# Patient Record
Sex: Male | Born: 1995 | Race: White | Hispanic: No | Marital: Single | Smoking: Current every day smoker
Health system: Southern US, Community
[De-identification: ages and names within clinical notes are randomized; demographics above are authoritative.]

## PROBLEM LIST (undated history)

## (undated) DIAGNOSIS — J45909 Unspecified asthma, uncomplicated: Secondary | ICD-10-CM

---
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
## 2017-07-30 ENCOUNTER — Emergency Department (HOSPITAL_COMMUNITY)
Admission: EM | Admit: 2017-07-30 | Discharge: 2017-07-31 | Disposition: A | Discharge status: Home / Self Care | Payer: Medicare Other | Attending: Emergency Medicine | Admitting: Emergency Medicine

## 2017-07-30 ENCOUNTER — Encounter (HOSPITAL_COMMUNITY): Payer: Self-pay | Admitting: *Deleted

## 2017-07-30 ENCOUNTER — Emergency Department (HOSPITAL_COMMUNITY): Payer: Medicare Other

## 2017-07-30 DIAGNOSIS — W260XXA Contact with knife, initial encounter: Secondary | ICD-10-CM | POA: Insufficient documentation

## 2017-07-30 DIAGNOSIS — Z79899 Other long term (current) drug therapy: Secondary | ICD-10-CM | POA: Insufficient documentation

## 2017-07-30 DIAGNOSIS — J45909 Unspecified asthma, uncomplicated: Secondary | ICD-10-CM | POA: Diagnosis not present

## 2017-07-30 DIAGNOSIS — T148XXA Other injury of unspecified body region, initial encounter: Secondary | ICD-10-CM

## 2017-07-30 DIAGNOSIS — S8992XA Unspecified injury of left lower leg, initial encounter: Secondary | ICD-10-CM | POA: Diagnosis present

## 2017-07-30 DIAGNOSIS — Y999 Unspecified external cause status: Secondary | ICD-10-CM | POA: Insufficient documentation

## 2017-07-30 DIAGNOSIS — F17200 Nicotine dependence, unspecified, uncomplicated: Secondary | ICD-10-CM | POA: Diagnosis not present

## 2017-07-30 DIAGNOSIS — Y939 Activity, unspecified: Secondary | ICD-10-CM | POA: Diagnosis not present

## 2017-07-30 DIAGNOSIS — Y92003 Bedroom of unspecified non-institutional (private) residence as the place of occurrence of the external cause: Secondary | ICD-10-CM | POA: Insufficient documentation

## 2017-07-30 DIAGNOSIS — Z23 Encounter for immunization: Secondary | ICD-10-CM | POA: Diagnosis not present

## 2017-07-30 DIAGNOSIS — S81812A Laceration without foreign body, left lower leg, initial encounter: Secondary | ICD-10-CM | POA: Diagnosis not present

## 2017-07-30 MED ORDER — HYDROCODONE-ACETAMINOPHEN 5-325 MG PO TABS
2.0000 | ORAL_TABLET | Freq: Once | ORAL | Status: AC
  Administered 2017-07-30: 2 via ORAL
  Filled 2017-07-30: qty 2

## 2017-07-30 MED ORDER — TETANUS-DIPHTH-ACELL PERTUSSIS 5-2.5-18.5 LF-MCG/0.5 IM SUSP
0.5000 mL | Freq: Once | INTRAMUSCULAR | Status: AC
  Administered 2017-07-30: 0.5 mL via INTRAMUSCULAR
  Filled 2017-07-30: qty 0.5

## 2017-07-30 MED ORDER — LIDOCAINE-EPINEPHRINE (PF) 2 %-1:200000 IJ SOLN
10.0000 mL | Freq: Once | INTRAMUSCULAR | Status: AC
  Administered 2017-07-30: 10 mL
  Filled 2017-07-30: qty 20
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
## 2017-07-30 NOTE — ED Notes (Signed)
Patient transported to X-ray 

## 2017-07-31 MED ORDER — NAPROXEN 500 MG PO TABS: 500.0000 mg | ORAL_TABLET | Freq: Two times a day (BID) | ORAL | 0 refills | Status: AC

## 2017-07-31 MED ORDER — HYDROCODONE-ACETAMINOPHEN 5-325 MG PO TABS: 1.0000 | ORAL_TABLET | Freq: Four times a day (QID) | ORAL | 0 refills | Status: AC | PRN
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
## 2017-07-31 NOTE — Discharge Instructions (Signed)
Medications: naprosyn, Norco  Treatment: Take Naprosyn twice daily as needed for your pain. Take 1-2 Norco every 6 hours as needed for severe pain. Keep dressing applied for 24 hours and wash with warm soapy water and apply antibiotic ointment and clean dressing every day until sutures are removed. Use crutches to ambulate as needed. Elevate your leg whenever you're not walking on it.  Do not drink alcohol, drive, operate machinery or participate in any other potentially dangerous activities while taking opiate pain medication as it may make you sleepy. Do not take this medication with any other sedating medications, either prescription or over-the-counter. If you were prescribed Percocet or Vicodin, do not take these with acetaminophen (Tylenol) as it is already contained within these medications and overdose of Tylenol is dangerous.   This medication is an opiate (or narcotic) pain medication and can be habit forming.  Use it as little as possible to achieve adequate pain control.  Do not use or use it with extreme caution if you have a history of opiate abuse or dependence. This medication is intended for your use only - do not give any to anyone else and keep it in a secure place where nobody else, especially children, have access to it. It will also cause or worsen constipation, so you may want to consider taking an over-the-counter stool softener while you are taking this medication.  Follow-up: Please return to the emergency department in 10-14 days to have your sutures removed. Please return sooner if you develop any new or worsening symptoms including fever, increasing pain, redness, swelling, drainage, red streaking from the area, your leg is getting cold, hard, or numb, or your sutures are coming out.

## 2017-07-31 NOTE — ED Notes (Signed)
Patient Alert and oriented X4. Stable and ambulatory. Patient verbalized understanding of the discharge instructions.  Patient belongings were taken by the patient.  

## 2017-08-13 ENCOUNTER — Emergency Department (HOSPITAL_COMMUNITY): Payer: Medicare Other

## 2017-08-13 ENCOUNTER — Emergency Department (HOSPITAL_COMMUNITY)
Admission: EM | Admit: 2017-08-13 | Discharge: 2017-08-13 | Disposition: A | Discharge status: Home / Self Care | Payer: Medicare Other | Attending: Emergency Medicine | Admitting: Emergency Medicine

## 2017-08-13 ENCOUNTER — Encounter (HOSPITAL_COMMUNITY): Payer: Self-pay | Admitting: *Deleted

## 2017-08-13 DIAGNOSIS — Z79899 Other long term (current) drug therapy: Secondary | ICD-10-CM | POA: Diagnosis not present

## 2017-08-13 DIAGNOSIS — J45909 Unspecified asthma, uncomplicated: Secondary | ICD-10-CM | POA: Insufficient documentation

## 2017-08-13 DIAGNOSIS — F17210 Nicotine dependence, cigarettes, uncomplicated: Secondary | ICD-10-CM | POA: Insufficient documentation

## 2017-08-13 DIAGNOSIS — Z4802 Encounter for removal of sutures: Secondary | ICD-10-CM | POA: Diagnosis present

## 2017-08-13 DIAGNOSIS — L089 Local infection of the skin and subcutaneous tissue, unspecified: Secondary | ICD-10-CM | POA: Insufficient documentation

## 2017-08-13 DIAGNOSIS — L7632 Postprocedural hematoma of skin and subcutaneous tissue following other procedure: Secondary | ICD-10-CM | POA: Diagnosis not present

## 2017-08-13 DIAGNOSIS — T148XXA Other injury of unspecified body region, initial encounter: Secondary | ICD-10-CM

## 2017-08-13 LAB — CBC WITH DIFFERENTIAL/PLATELET
BASOS ABS: 0 10*3/uL (ref 0.0–0.1)
BASOS PCT: 0 %
EOS ABS: 0.2 10*3/uL (ref 0.0–0.7)
EOS PCT: 2 %
HCT: 44.1 % (ref 39.0–52.0)
Hemoglobin: 15 g/dL (ref 13.0–17.0)
Lymphocytes Relative: 34 %
Lymphs Abs: 3.3 10*3/uL (ref 0.7–4.0)
MCH: 29 pg (ref 26.0–34.0)
MCHC: 34 g/dL (ref 30.0–36.0)
MCV: 85.1 fL (ref 78.0–100.0)
MONOS PCT: 5 %
Monocytes Absolute: 0.4 10*3/uL (ref 0.1–1.0)
Neutro Abs: 5.7 10*3/uL (ref 1.7–7.7)
Neutrophils Relative %: 59 %
PLATELETS: 230 10*3/uL (ref 150–400)
RBC: 5.18 MIL/uL (ref 4.22–5.81)
RDW: 14 % (ref 11.5–15.5)
WBC: 9.7 10*3/uL (ref 4.0–10.5)

## 2017-08-13 LAB — BASIC METABOLIC PANEL
Anion gap: 10 (ref 5–15)
BUN: 11 mg/dL (ref 6–20)
CALCIUM: 9.6 mg/dL (ref 8.9–10.3)
CO2: 23 mmol/L (ref 22–32)
CREATININE: 0.71 mg/dL (ref 0.61–1.24)
Chloride: 105 mmol/L (ref 101–111)
GFR calc Af Amer: >60 mL/min (ref >60)
Glucose, Bld: 88 mg/dL (ref 65–99)
Potassium: 4.2 mmol/L (ref 3.5–5.1)
Sodium: 138 mmol/L (ref 135–145)

## 2017-08-13 MED ORDER — IOPAMIDOL (ISOVUE-300) INJECTION 61%
INTRAVENOUS | Status: AC
  Administered 2017-08-13: 100 mL
  Filled 2017-08-13: qty 100

## 2017-08-13 MED ORDER — VANCOMYCIN HCL 10 G IV SOLR
2500.0000 mg | Freq: Once | INTRAVENOUS | Status: AC
  Administered 2017-08-13: 2500 mg via INTRAVENOUS
  Filled 2017-08-13: qty 2500

## 2017-08-13 MED ORDER — VANCOMYCIN HCL 10 G IV SOLR
1250.0000 mg | Freq: Three times a day (TID) | INTRAVENOUS | Status: DC
  Filled 2017-08-13 (×2): qty 1250

## 2017-08-13 MED ORDER — DIPHENHYDRAMINE HCL 50 MG/ML IJ SOLN
25.0000 mg | Freq: Once | INTRAMUSCULAR | Status: AC
Start: 2017-08-13 — End: 2017-08-13
  Administered 2017-08-13: 25 mg via INTRAVENOUS
  Filled 2017-08-13: qty 1

## 2017-08-13 MED ORDER — SULFAMETHOXAZOLE-TRIMETHOPRIM 800-160 MG PO TABS: 1.0000 | ORAL_TABLET | Freq: Two times a day (BID) | ORAL | 0 refills | Status: AC

## 2017-08-13 MED ORDER — FAMOTIDINE IN NACL 20-0.9 MG/50ML-% IV SOLN
20.0000 mg | Freq: Once | INTRAVENOUS | Status: AC
  Administered 2017-08-13: 20 mg via INTRAVENOUS
  Filled 2017-08-13: qty 50
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
## 2017-08-13 NOTE — ED Notes (Signed)
Patient transported to CT 
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
## 2017-08-13 NOTE — Discharge Instructions (Signed)
Please read instructions below.  Keep your wound clean and covered. Apply a thin layer of bacitracin to your wound daily and then cover with a bandage. Soak/flush your wound with warm water, multiple times per day. Starting tomorrow, begin taking the antibiotic, Bactrim, 2 times daily until it is gone.  You can take naproxen or tylenol  as needed for pain. Follow up with your primary care or this ER for wound recheck in 2 days.  Return to the ER for fever, worsening redness, or new or worsening symptoms.

## 2017-08-13 NOTE — ED Notes (Signed)
PT states understanding of care given, follow up care, and medication prescribed. PT ambulated from ED to car with a steady gait. 
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
## 2019-12-29 ENCOUNTER — Other Ambulatory Visit: Payer: Self-pay | Admitting: Nurse Practitioner

## 2019-12-29 DIAGNOSIS — M5442 Lumbago with sciatica, left side: Secondary | ICD-10-CM

## 2020-01-26 ENCOUNTER — Other Ambulatory Visit: Payer: Medicare Other

## 2020-02-02 ENCOUNTER — Ambulatory Visit: Payer: Medicaid Other

## 2020-02-05 ENCOUNTER — Other Ambulatory Visit: Payer: Medicare Other

## 2020-02-05 ENCOUNTER — Ambulatory Visit: Payer: Medicaid Other | Admitting: Physical Therapy

## 2020-03-04 ENCOUNTER — Ambulatory Visit
Admission: RE | Admit: 2020-03-04 | Discharge: 2020-03-04 | Disposition: A | Discharge status: Home / Self Care | Payer: Medicare HMO | Source: Ambulatory Visit | Attending: Nurse Practitioner | Admitting: Nurse Practitioner

## 2020-03-04 DIAGNOSIS — M5442 Lumbago with sciatica, left side: Secondary | ICD-10-CM

## 2020-03-14 ENCOUNTER — Ambulatory Visit: Payer: Medicare Other | Admitting: Neurology

## 2020-04-15 ENCOUNTER — Encounter: Payer: Self-pay | Admitting: Neurology

## 2020-04-15 ENCOUNTER — Ambulatory Visit: Payer: Medicare HMO | Admitting: Neurology
